# Patient Record
Sex: Female | Born: 1990 | Race: Black or African American | Hispanic: No | Marital: Single | Smoking: Former smoker
Health system: Southern US, Community
[De-identification: ages and names within clinical notes are randomized; demographics above are authoritative.]

---
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
## 2010-09-18 LAB — WET PREP, GENITAL
Trich, Wet Prep: NONE SEEN
Yeast Wet Prep HPF POC: NONE SEEN

## 2010-09-18 LAB — URINALYSIS, ROUTINE W REFLEX MICROSCOPIC
Bilirubin Urine: NEGATIVE
Glucose, UA: NEGATIVE mg/dL
Ketones, ur: NEGATIVE mg/dL
Nitrite: NEGATIVE
Protein, ur: 30 mg/dL — AB
Specific Gravity, Urine: 1.02 (ref 1.005–1.030)
Urobilinogen, UA: 0.2 mg/dL (ref 0.0–1.0)
pH: 8 (ref 5.0–8.0)

## 2010-09-18 LAB — CBC
Platelets: 197 10*3/uL (ref 150–400)
RBC: 3.95 MIL/uL (ref 3.87–5.11)
WBC: 6.1 10*3/uL (ref 4.0–10.5)

## 2010-09-18 LAB — URINE MICROSCOPIC-ADD ON

## 2010-09-18 LAB — POCT PREGNANCY, URINE: Preg Test, Ur: NEGATIVE

## 2010-09-18 LAB — GC/CHLAMYDIA PROBE AMP, GENITAL: GC Probe Amp, Genital: NEGATIVE

## 2010-09-22 LAB — URINE CULTURE: Colony Count: >=100000

## 2010-09-22 LAB — CBC
HCT: 34.4 % — ABNORMAL LOW (ref 36.0–46.0)
MCHC: 33.9 g/dL (ref 30.0–36.0)
MCV: 91 fL (ref 78.0–100.0)
Platelets: 193 10*3/uL (ref 150–400)
WBC: 8.1 10*3/uL (ref 4.0–10.5)

## 2010-09-22 LAB — URINALYSIS, ROUTINE W REFLEX MICROSCOPIC
Bilirubin Urine: NEGATIVE
Glucose, UA: NEGATIVE mg/dL
Hgb urine dipstick: NEGATIVE
Ketones, ur: 15 mg/dL — AB
Protein, ur: NEGATIVE mg/dL
pH: 7 (ref 5.0–8.0)

## 2010-09-22 LAB — URINE MICROSCOPIC-ADD ON

## 2010-09-22 LAB — WET PREP, GENITAL

## 2010-09-22 LAB — GC/CHLAMYDIA PROBE AMP, GENITAL
Chlamydia, DNA Probe: POSITIVE — AB
GC Probe Amp, Genital: NEGATIVE

## 2014-04-03 ENCOUNTER — Emergency Department (HOSPITAL_COMMUNITY)
Admission: EM | Admit: 2014-04-03 | Discharge: 2014-04-03 | Disposition: A | Discharge status: Home / Self Care | Payer: No Typology Code available for payment source | Attending: Emergency Medicine | Admitting: Emergency Medicine

## 2014-04-03 ENCOUNTER — Encounter (HOSPITAL_COMMUNITY): Payer: Self-pay | Admitting: Emergency Medicine

## 2014-04-03 DIAGNOSIS — Y9389 Activity, other specified: Secondary | ICD-10-CM | POA: Diagnosis not present

## 2014-04-03 DIAGNOSIS — S060X0A Concussion without loss of consciousness, initial encounter: Secondary | ICD-10-CM | POA: Insufficient documentation

## 2014-04-03 DIAGNOSIS — Z793 Long term (current) use of hormonal contraceptives: Secondary | ICD-10-CM | POA: Diagnosis not present

## 2014-04-03 DIAGNOSIS — S0990XA Unspecified injury of head, initial encounter: Secondary | ICD-10-CM

## 2014-04-03 DIAGNOSIS — Y92481 Parking lot as the place of occurrence of the external cause: Secondary | ICD-10-CM | POA: Insufficient documentation

## 2014-04-03 MED ORDER — NAPROXEN 500 MG PO TABS: 500.0000 mg | ORAL_TABLET | Freq: Two times a day (BID) | ORAL | Status: AC

## 2014-04-03 MED ORDER — KETOROLAC TROMETHAMINE 60 MG/2ML IM SOLN
60.0000 mg | Freq: Once | INTRAMUSCULAR | Status: AC
  Administered 2014-04-03: 60 mg via INTRAMUSCULAR
  Filled 2014-04-03: qty 2

## 2014-04-03 NOTE — Discharge Instructions (Signed)
Please call your doctor for a followup appointment within 24-48 hours. When you talk to your doctor please let them know that you were seen in the emergency department and have them acquire all of your records so that they can discuss the findings with you and formulate a treatment plan to fully care for your new and ongoing problems. ° °
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
## 2015-09-02 ENCOUNTER — Emergency Department (HOSPITAL_COMMUNITY)
Admission: EM | Admit: 2015-09-02 | Discharge: 2015-09-02 | Disposition: A | Discharge status: Home / Self Care | Payer: No Typology Code available for payment source | Attending: Emergency Medicine | Admitting: Emergency Medicine

## 2015-09-02 ENCOUNTER — Encounter (HOSPITAL_COMMUNITY): Payer: Self-pay

## 2015-09-02 DIAGNOSIS — S0181XA Laceration without foreign body of other part of head, initial encounter: Secondary | ICD-10-CM | POA: Insufficient documentation

## 2015-09-02 DIAGNOSIS — Y9389 Activity, other specified: Secondary | ICD-10-CM | POA: Insufficient documentation

## 2015-09-02 DIAGNOSIS — S0191XA Laceration without foreign body of unspecified part of head, initial encounter: Secondary | ICD-10-CM

## 2015-09-02 DIAGNOSIS — Z791 Long term (current) use of non-steroidal anti-inflammatories (NSAID): Secondary | ICD-10-CM | POA: Insufficient documentation

## 2015-09-02 DIAGNOSIS — Y990 Civilian activity done for income or pay: Secondary | ICD-10-CM | POA: Insufficient documentation

## 2015-09-02 DIAGNOSIS — W228XXA Striking against or struck by other objects, initial encounter: Secondary | ICD-10-CM | POA: Insufficient documentation

## 2015-09-02 DIAGNOSIS — Y9289 Other specified places as the place of occurrence of the external cause: Secondary | ICD-10-CM | POA: Insufficient documentation

## 2015-09-02 DIAGNOSIS — F17210 Nicotine dependence, cigarettes, uncomplicated: Secondary | ICD-10-CM | POA: Insufficient documentation

## 2015-09-02 DIAGNOSIS — Z793 Long term (current) use of hormonal contraceptives: Secondary | ICD-10-CM | POA: Insufficient documentation

## 2015-09-02 DIAGNOSIS — Z23 Encounter for immunization: Secondary | ICD-10-CM | POA: Insufficient documentation

## 2015-09-02 MED ORDER — TETANUS-DIPHTH-ACELL PERTUSSIS 5-2.5-18.5 LF-MCG/0.5 IM SUSP
0.5000 mL | Freq: Once | INTRAMUSCULAR | Status: AC
  Administered 2015-09-02: 0.5 mL via INTRAMUSCULAR
  Filled 2015-09-02: qty 0.5

## 2015-09-02 MED ORDER — MUPIROCIN 2 % EX OINT: TOPICAL_OINTMENT | CUTANEOUS | Status: AC

## 2015-09-02 NOTE — ED Provider Notes (Signed)
CSN: [id number]     Arrival date & time 09/02/15  2141 History  By signing my name below, I, Karen Montgomery, attest that this documentation has been prepared under the direction and in the presence of  [facility], PA-C. Electronically Signed: Doreatha Montgomery, ED Scribe. 09/02/2015. 10:15 PM.     Chief Complaint  Patient presents with  . Head Laceration   The history is provided by the patient. No language interpreter was used.    HPI Comments: ARIYANNAH Montgomery is a 25 y.o. female who presents to the Emergency Department complaining of a laceration with controlled bleeding to the left forehead that occurred yesterday after hitting her head on a plastic buckle at work. She denies LOC, nausea, emesis. She reports associated mild tenderness with palpation to the area. Pt notes that she cleaned the wound after initial injury. Tdap out of date. Pt denies additional injuries, numbness.   History reviewed. No pertinent past medical history. History reviewed. No pertinent past surgical history. No family history on file. Social History  Substance Use Topics  . Smoking status: Current Some Day Smoker    Types: Cigarettes  . Smokeless tobacco: None  . Alcohol Use: Yes     Comment: occasional   OB History    No data available     Review of Systems  Gastrointestinal: Negative for nausea and vomiting.  Musculoskeletal: Positive for arthralgias.  Skin: Positive for wound.  Neurological: Negative for numbness.   Allergies  Review of patient's allergies indicates no known allergies.  Home Medications   Prior to Admission medications   Medication Sig Start Date End Date Taking? Authorizing Provider  ibuprofen (ADVIL,MOTRIN) 200 MG tablet Take 400 mg by mouth every 6 (six) hours as needed for mild pain.    Historical Provider, MD  mupirocin ointment (BACTROBAN) 2 % Apply to affected area twice daily. 09/02/15   Chase Picket Ossie Yebra, PA-C  naproxen (NAPROSYN) 500 MG tablet Take 1 tablet (500 mg  total) by mouth 2 (two) times daily with a meal. 04/03/14   Eber Hong, MD  norethindrone-ethinyl estradiol (JUNEL FE,GILDESS FE,LOESTRIN FE) 1-20 MG-MCG tablet Take 1 tablet by mouth daily.    Historical Provider, MD   BP 105/61 mmHg  Pulse 62  Temp(Src) 98.2 F (36.8 C) (Oral)  Resp 16  Ht  (1.676 m)  Wt 50.122 kg  BMI 17.84 kg/m2  SpO2 100%  LMP 08/23/2015 Physical Exam  Constitutional: She is oriented to person, place, and time. She appears well-developed and well-nourished.  HENT:  Head: Normocephalic.  Neck: Normal range of motion. Neck supple.  Cardiovascular: Normal rate, regular rhythm and normal heart sounds.  Exam reveals no gallop and no friction rub.   No murmur heard. Pulmonary/Chest: Effort normal and breath sounds normal. No respiratory distress. She has no wheezes. She has no rales.  Abdominal: She exhibits no distension.  Musculoskeletal: Normal range of motion.  Neurological: She is alert and oriented to person, place, and time.  Skin: Skin is warm and dry.  0.5 cm laceration to the left forehead. Bleeding controlled. No surrounding erythema.  Psychiatric: She has a normal mood and affect. Her behavior is normal.  Nursing note and vitals reviewed.   ED Course  Procedures (including critical care time) DIAGNOSTIC STUDIES: Oxygen Saturation is 99% on RA, normal by my interpretation.    COORDINATION OF CARE: 10:14 PM Discussed treatment plan with pt at bedside which includes Tdap update, wound care and pt agreed to plan.  MDM   Final diagnoses:  Laceration of head, initial encounter    Karen Montgomery presents to the ED with a 0.5 cm laceration to the left temple with hemostasis that occurred yesterday. Wound care performed in the ED. Lac size is small and issue not viable for laceration repair. Pt will be sent home with mupirocin ointment. Home care instructions discussed and recommended. Pt advised to follow up with PCP as needed, or with worsening  symptoms. Pt appears stable for discharge at this time. Return precautions discussed and outlined in discharge paperwork. Pt is agreeable to plan.   I personally performed the services described in this documentation, which was scribed in my presence. The recorded information has been reviewed and is accurate.   [facility] Arisbeth Purrington, PA-C 09/02/15 1610  Pricilla Loveless, MD 09/03/15 [phone]
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
## 2015-09-02 NOTE — Discharge Instructions (Signed)
Keep affected area clean and dry. Use antibiotic ointment as directed. Return to ER for any new or worsening symptoms, any additional concerns.

## 2016-01-21 ENCOUNTER — Emergency Department (HOSPITAL_COMMUNITY)
Admission: EM | Admit: 2016-01-21 | Discharge: 2016-01-21 | Disposition: A | Discharge status: Home / Self Care | Payer: No Typology Code available for payment source | Attending: Emergency Medicine | Admitting: Emergency Medicine

## 2016-01-21 ENCOUNTER — Encounter (HOSPITAL_COMMUNITY): Payer: Self-pay

## 2016-01-21 DIAGNOSIS — R21 Rash and other nonspecific skin eruption: Secondary | ICD-10-CM | POA: Insufficient documentation

## 2016-01-21 DIAGNOSIS — F17210 Nicotine dependence, cigarettes, uncomplicated: Secondary | ICD-10-CM | POA: Insufficient documentation

## 2016-01-21 LAB — WET PREP, GENITAL
SPERM: NONE SEEN
TRICH WET PREP: NONE SEEN
YEAST WET PREP: NONE SEEN

## 2016-01-21 LAB — POC URINE PREG, ED: Preg Test, Ur: NEGATIVE

## 2016-01-21 NOTE — ED Notes (Signed)
Pt. C/o rash around groin area that started yesterday morning. Pt. Notes that the rash is itchy. Pt. Does report putting an OTC cream on it. Pt. Last sexually active Tuesday. Pt. Denies pain.

## 2016-01-21 NOTE — ED Provider Notes (Signed)
CSN: [id number]     Arrival date & time 01/21/16  4098 History   First MD Initiated Contact with Patient 01/21/16 0932     Chief Complaint  Patient presents with  . Rash     (Consider location/radiation/quality/duration/timing/severity/associated sxs/prior Treatment) HPI  25 year old female presents with a rash that she noticed yesterday morning when she first woke up. The rash has not worsened but also has not improved. It is itchy and sometimes irritated with burning whenever it is rubbed against her pants. Some of the rash she has noticed seems to drain. Denies vaginal bleeding or discharge. She does have unprotected intercourse and is now concerned about possible STI but has never had one before and does not have a partner that currently has symptoms. When not touched, the rash does not burn or hurt. Tried hydrocortisone cream that her grandmother gave her with no relief.  History reviewed. No pertinent past medical history. History reviewed. No pertinent past surgical history. History reviewed. No pertinent family history. Social History  Substance Use Topics  . Smoking status: Current Some Day Smoker    Types: Cigarettes  . Smokeless tobacco: None  . Alcohol Use: Yes     Comment: occasional   OB History    No data available     Review of Systems  Constitutional: Negative for fever.  Gastrointestinal: Negative for abdominal pain.  Genitourinary: Negative for dysuria, vaginal bleeding, vaginal discharge and menstrual problem.  Skin: Positive for rash.  All other systems reviewed and are negative.     Allergies  Review of patient's allergies indicates no known allergies.  Home Medications   Prior to Admission medications   Medication Sig Start Date End Date Taking? Authorizing Provider  ibuprofen (ADVIL,MOTRIN) 200 MG tablet Take 400 mg by mouth every 6 (six) hours as needed for mild pain.    Historical Provider, MD  mupirocin ointment (BACTROBAN) 2 % Apply to  affected area twice daily. 09/02/15   Chase Picket Ward, PA-C  naproxen (NAPROSYN) 500 MG tablet Take 1 tablet (500 mg total) by mouth 2 (two) times daily with a meal. 04/03/14   Eber Hong, MD  norethindrone-ethinyl estradiol (JUNEL FE,GILDESS FE,LOESTRIN FE) 1-20 MG-MCG tablet Take 1 tablet by mouth daily.    Historical Provider, MD   BP 105/89 mmHg  Pulse 73  Temp(Src) 98.8 F (37.1 C) (Oral)  Resp 16  Ht  (1.676 m)  Wt 115 lb (52.164 kg)  BMI 18.57 kg/m2  SpO2 100%  LMP 12/21/2015 Physical Exam  Constitutional: She is oriented to person, place, and time. She appears well-developed and well-nourished.  HENT:  Head: Normocephalic and atraumatic.  Right Ear: External ear normal.  Left Ear: External ear normal.  Nose: Nose normal.  Eyes: Right eye exhibits no discharge. Left eye exhibits no discharge.  Cardiovascular: Normal rate, regular rhythm and normal heart sounds.   Pulmonary/Chest: Effort normal and breath sounds normal.  Abdominal: Soft. She exhibits no distension. There is no tenderness.    Genitourinary: Cervix exhibits no discharge and no friability. No bleeding in the vagina. No vaginal discharge found.  Neurological: She is alert and oriented to person, place, and time.  Skin: Skin is warm and dry.  Nursing note and vitals reviewed.   ED Course  Procedures (including critical care time) Labs Review Labs Reviewed  WET PREP, GENITAL - Abnormal; Notable for the following:    Clue Cells Wet Prep HPF POC PRESENT (*)    WBC, Wet Prep HPF POC  FEW (*)    All other components within normal limits  HSV CULTURE AND TYPING  RPR  HIV ANTIBODY (ROUTINE TESTING)  POC URINE PREG, ED  GC/CHLAMYDIA PROBE AMP ([place]    Imaging Review No results found. I have personally reviewed and evaluated these images and lab results as part of my medical decision-making.   EKG Interpretation None      MDM   Final diagnoses:  Rash and nonspecific skin  eruption    Unclear exactly what is causing the rash. However with some clear drainage, will test for H SV, although it does not appear consistent with herpes. Given it is not tender, will send for syphilis testing. She does not have any vaginal discharge or vaginal symptoms and thus do not think the clue cells need to be treated. We'll send for gonorrhea, chlamydia, RPR, HIV. Discussed these results when I come back today. At this point I do not think it is an STI but given location needs to be tested. Discussed follow-up with OB/GYN for yearly screening.    Pricilla LovelessScott Ruairi Stutsman, MD 01/21/16 [phone]

## 2016-01-22 LAB — RPR: RPR: NONREACTIVE

## 2016-01-22 LAB — HIV ANTIBODY (ROUTINE TESTING W REFLEX): HIV SCREEN 4TH GENERATION: NONREACTIVE

## 2016-01-24 LAB — GC/CHLAMYDIA PROBE AMP (~~LOC~~) NOT AT ARMC
Chlamydia: POSITIVE — AB
Neisseria Gonorrhea: NEGATIVE

## 2016-01-25 ENCOUNTER — Telehealth (HOSPITAL_COMMUNITY): Payer: Self-pay

## 2016-01-25 LAB — HSV CULTURE AND TYPING

## 2016-01-25 NOTE — Telephone Encounter (Signed)
Positive for chlamydia. Chart sent to edp office for review 

## 2016-01-26 ENCOUNTER — Telehealth (HOSPITAL_BASED_OUTPATIENT_CLINIC_OR_DEPARTMENT_OTHER): Payer: Self-pay | Admitting: *Deleted

## 2016-01-26 NOTE — Telephone Encounter (Signed)
Positive for chlamydia and was prescribed azithromycin 1000mg . Po x 1dose no refill prescribed by Thornton Dales. Called [facility] pharmacy as patient choice at 4:42.

## 2016-10-27 ENCOUNTER — Emergency Department (HOSPITAL_COMMUNITY)
Admission: EM | Admit: 2016-10-27 | Discharge: 2016-10-28 | Disposition: A | Discharge status: Home / Self Care | Payer: Self-pay | Attending: Emergency Medicine | Admitting: Emergency Medicine

## 2016-10-27 DIAGNOSIS — H1031 Unspecified acute conjunctivitis, right eye: Secondary | ICD-10-CM | POA: Insufficient documentation

## 2016-10-27 DIAGNOSIS — F17210 Nicotine dependence, cigarettes, uncomplicated: Secondary | ICD-10-CM | POA: Insufficient documentation

## 2016-10-27 MED ORDER — TETRACAINE HCL 0.5 % OP SOLN
1.0000 [drp] | Freq: Once | OPHTHALMIC | Status: AC
  Administered 2016-10-28: 1 [drp] via OPHTHALMIC
  Filled 2016-10-27: qty 2

## 2016-10-27 MED ORDER — FLUORESCEIN SODIUM 0.6 MG OP STRP
1.0000 | ORAL_STRIP | Freq: Once | OPHTHALMIC | Status: AC
  Administered 2016-10-28: 1 via OPHTHALMIC
  Filled 2016-10-27: qty 1

## 2016-10-27 MED ORDER — CIPROFLOXACIN HCL 0.3 % OP SOLN
2.0000 [drp] | Freq: Once | OPHTHALMIC | Status: AC
  Administered 2016-10-27: 2 [drp] via OPHTHALMIC
  Filled 2016-10-27: qty 2.5

## 2016-10-27 NOTE — ED Triage Notes (Signed)
Pt c/o 7/10 right eye pain, states she thinks her contacts damage her right eye, having photophobia and pain with eye movement. Pt states she removed her Contac lent yesterday night.

## 2016-10-27 NOTE — ED Provider Notes (Signed)
[facility]-EMERGENCY DEPT Provider Note   CSN: [id number] Arrival date & time: 10/27/16  1900  By signing my name below, I, [name] and [name], attest that this documentation has been prepared under the direction and in the presence of Felicie Morn, NP  Electronically Signed: Deland Pretty and [name], ED Scribe. 10/27/16. 8:56 PM.    History   Chief Complaint Chief Complaint  Patient presents with  . Eye Pain   The history is provided by the patient. No language interpreter was used.  Eye Pain  This is a new problem. The current episode started 12 to 24 hours ago. The problem occurs constantly. The problem has been gradually worsening. Associated symptoms include headaches. Exacerbated by: light. Nothing relieves the symptoms. She has tried nothing for the symptoms. The treatment provided no relief.    HPI Comments: Karen Montgomery is a 26 y.o. female who presents to the Emergency Department complaining of 7/10 right eye pain/irritation that began last night. Per pt, she removed her contacts last night d/t a foreign body sensation behind the right one and woke up in the morning with increased pain and matting with clear drainage. Pt went to work, but due to associated photophobia, blurry right eye vision, pain with eye movement and headache, she sought treatment in the ED. The pt denies fevers, chills, and changes in vision in the other eye. The pt has no known drug allergies.   No past medical history on file.  There are no active problems to display for this patient.   No past surgical history on file.  OB History    No data available       Home Medications    Prior to Admission medications   Medication Sig Start Date End Date Taking? Authorizing Provider  hydrocortisone cream 1 % Apply 1 application topically daily as needed for itching.    Historical Provider, MD  ibuprofen (ADVIL,MOTRIN) 200 MG tablet Take 400 mg by mouth every 6 (six) hours as needed for  mild pain.    Historical Provider, MD  mupirocin ointment (BACTROBAN) 2 % Apply to affected area twice daily. Patient not taking: Reported on 01/21/2016 09/02/15   [facility] Ward, PA-C  naproxen (NAPROSYN) 500 MG tablet Take 1 tablet (500 mg total) by mouth 2 (two) times daily with a meal. Patient not taking: Reported on 01/21/2016 04/03/14   Eber Hong, MD    Family History No family history on file.  Social History Social History  Substance Use Topics  . Smoking status: Current Some Day Smoker    Types: Cigarettes  . Smokeless tobacco: Not on file  . Alcohol use Yes     Comment: occasional     Allergies   Patient has no known allergies.   Review of Systems Review of Systems  Constitutional: Negative for chills and fever.  Eyes: Positive for photophobia, pain, discharge and visual disturbance.  Neurological: Positive for headaches.  All other systems reviewed and are negative.    Physical Exam Updated Vital Signs BP (!) 124/59 (BP Location: Right Arm)   Pulse (!) 55   Temp 98.6 F (37 C) (Oral)   Resp 18   Ht  (1.676 m)   Wt 110 lb (49.9 kg)   LMP 10/17/2016   SpO2 100%   BMI 17.75 kg/m   Physical Exam  Constitutional: She appears well-developed and well-nourished. No distress.  HENT:  Head: Normocephalic and atraumatic.  Eyes: EOM and lids are normal. Pupils  are equal, round, and reactive to light. Lids are everted and swept, no foreign bodies found. Right eye exhibits discharge. Right conjunctiva is injected. Left conjunctiva is not injected.  Neck: Neck supple.  Cardiovascular: Normal rate and regular rhythm.   No murmur heard. Pulmonary/Chest: Effort normal and breath sounds normal. No respiratory distress.  Abdominal: Soft. There is no tenderness.  Musculoskeletal: She exhibits no edema.  Neurological: She is alert.  Skin: Skin is warm and dry.  Psychiatric: She has a normal mood and affect.  Nursing note and vitals reviewed.    ED  Treatments / Results  DIAGNOSTIC STUDIES: Oxygen Saturation is 100% on RA, normal by my interpretation.   COORDINATION OF CARE: 8:40 PM-Discussed next steps with pt. Pt verbalized understanding and is agreeable with the plan.   Radiology No results found.  Procedures Procedures (including critical care time)  Medications Ordered in ED Medications  fluorescein ophthalmic strip 1 strip (1 strip Right Eye Given by Other 10/28/16 0001)  tetracaine (PONTOCAINE) 0.5 % ophthalmic solution 1 drop (1 drop Right Eye Given by Other 10/28/16 0001)  ciprofloxacin (CILOXAN) 0.3 % ophthalmic solution 2 drop (2 drops Right Eye Given 10/27/16 2253)     Initial Impression / Assessment and Plan / ED Course  I have reviewed the triage vital signs and the nursing notes.  Pertinent labs & imaging results that were available during my care of the patient were reviewed by me and considered in my medical decision making (see chart for details).     Patient presentation consistent with conjunctivitis.  No evidence of corneal abrasions, entrapment, consensual photophobia, or herpes keratitis.  Presentation not concerning for iritis, or corneal abrasions.  Pt discharged with ciprofloxacin eye drops.  Personal hygiene and frequent handwashing discussed.  Patient advised to follow up with ophthalmologist if symptoms persist or worsen. Return precautions discussed.  Patient verbalizes understanding and is agreeable with discharge.  Final Clinical Impressions(s) / ED Diagnoses   Final diagnoses:  Acute conjunctivitis of right eye, unspecified acute conjunctivitis type    New Prescriptions Discharge Medication List as of 10/27/2016 11:03 PM      I personally performed the services described in this documentation, which was scribed in my presence. The recorded information has been reviewed and is accurate.     Felicie Morn, NP 10/28/16 0147    Mancel Bale, MD 10/28/16 [phone]

## 2016-10-27 NOTE — Discharge Instructions (Signed)
Instill 2 drops of the cipro into the right eye 4 times daily for 7 days.  Please follow-up with your eye care provider as discussed.

## 2016-10-27 NOTE — ED Notes (Addendum)
Pt wears glasses and contacts full time but did not have them with her which resulted in an inaccurate visual acuity.

## 2020-07-21 ENCOUNTER — Other Ambulatory Visit: Payer: Self-pay

## 2020-07-21 ENCOUNTER — Encounter (HOSPITAL_COMMUNITY): Payer: Self-pay | Admitting: Emergency Medicine

## 2020-07-21 ENCOUNTER — Ambulatory Visit (HOSPITAL_COMMUNITY)
Admission: EM | Admit: 2020-07-21 | Discharge: 2020-07-21 | Disposition: A | Discharge status: Home / Self Care | Payer: HRSA Program | Attending: Emergency Medicine | Admitting: Emergency Medicine

## 2020-07-21 DIAGNOSIS — R519 Headache, unspecified: Secondary | ICD-10-CM | POA: Insufficient documentation

## 2020-07-21 DIAGNOSIS — F17210 Nicotine dependence, cigarettes, uncomplicated: Secondary | ICD-10-CM | POA: Diagnosis not present

## 2020-07-21 DIAGNOSIS — R509 Fever, unspecified: Secondary | ICD-10-CM | POA: Insufficient documentation

## 2020-07-21 DIAGNOSIS — B349 Viral infection, unspecified: Secondary | ICD-10-CM | POA: Diagnosis not present

## 2020-07-21 DIAGNOSIS — U071 COVID-19: Secondary | ICD-10-CM | POA: Insufficient documentation

## 2020-07-21 NOTE — ED Provider Notes (Signed)
[facility]    CSN: [id number] Arrival date & time: 07/21/20  1629      History   Chief Complaint Chief Complaint  Patient presents with  . Headache    HPI Karen Montgomery is a 30 y.o. female.   Patient presents with 3-day history of headache and low-grade fever.  She denies cough, shortness of breath, vomiting, diarrhea, or other symptoms.  Treatment attempted at home with Tylenol.  No pertinent medical history.  The history is provided by the patient.    History reviewed. No pertinent past medical history.  There are no problems to display for this patient.   History reviewed. No pertinent surgical history.  OB History   No obstetric history on file.      Home Medications    Prior to Admission medications   Medication Sig Start Date End Date Taking? Authorizing Provider  hydrocortisone cream 1 % Apply 1 application topically daily as needed for itching.    [provider]  ibuprofen (ADVIL,MOTRIN) 200 MG tablet Take 400 mg by mouth every 6 (six) hours as needed for mild pain.    [provider]  mupirocin ointment (BACTROBAN) 2 % Apply to affected area twice daily. Patient not taking: Reported on 01/21/2016 09/02/15   Ward, Chase Picket, PA-C  naproxen (NAPROSYN) 500 MG tablet Take 1 tablet (500 mg total) by mouth 2 (two) times daily with a meal. Patient not taking: Reported on 01/21/2016 04/03/14   Eber Hong, MD    Family History History reviewed. No pertinent family history.  Social History Social History   Tobacco Use  . Smoking status: Current Some Day Smoker    Types: Cigarettes  . Smokeless tobacco: Never Used  Substance Use Topics  . Alcohol use: Yes    Comment: occasional  . Drug use: No     Allergies   Patient has no known allergies.   Review of Systems Review of Systems  Constitutional: Positive for fever. Negative for chills.  HENT: Negative for ear pain and sore throat.   Eyes: Negative for pain and  visual disturbance.  Respiratory: Negative for cough and shortness of breath.   Cardiovascular: Negative for chest pain and palpitations.  Gastrointestinal: Negative for abdominal pain, diarrhea and vomiting.  Genitourinary: Negative for dysuria and hematuria.  Musculoskeletal: Negative for arthralgias and back pain.  Skin: Negative for color change and rash.  Neurological: Positive for headaches. Negative for syncope, weakness and numbness.  All other systems reviewed and are negative.    Physical Exam Triage Vital Signs ED Triage Vitals  Enc Vitals Group     BP      Pulse      Resp      Temp      Temp src      SpO2      Weight      Height      Head Circumference      Peak Flow      Pain Score      Pain Loc      Pain Edu?      Excl. in GC?    No data found.  Updated Vital Signs BP 114/67 (BP Location: Right Arm)   Pulse 86   Temp 99.9 F (37.7 C) (Oral)   Resp 16   Ht 5\' 6"  (1.676 m)   Wt 115 lb (52.2 kg)   LMP 07/06/2020   SpO2 98%   BMI 18.56 kg/m   Visual Acuity  Right Eye Distance:   Left Eye Distance:   Bilateral Distance:    Right Eye Near:   Left Eye Near:    Bilateral Near:     Physical Exam Vitals and nursing note reviewed.  Constitutional:      General: She is not in acute distress.    Appearance: She is well-developed and well-nourished. She is not ill-appearing.  HENT:     Head: Normocephalic and atraumatic.     Right Ear: Tympanic membrane normal.     Left Ear: Tympanic membrane normal.     Nose: Nose normal.     Mouth/Throat:     Mouth: Mucous membranes are moist.     Pharynx: Oropharynx is clear.  Eyes:     Conjunctiva/sclera: Conjunctivae normal.  Cardiovascular:     Rate and Rhythm: Normal rate and regular rhythm.     Heart sounds: Normal heart sounds.  Pulmonary:     Effort: Pulmonary effort is normal. No respiratory distress.     Breath sounds: Normal breath sounds.  Abdominal:     Palpations: Abdomen is soft.      Tenderness: There is no abdominal tenderness. There is no guarding or rebound.  Musculoskeletal:        General: No edema.     Cervical back: Neck supple.  Skin:    General: Skin is warm and dry.     Findings: No rash.  Neurological:     General: No focal deficit present.     Mental Status: She is alert and oriented to person, place, and time.     Sensory: No sensory deficit.     Motor: No weakness.     Gait: Gait normal.  Psychiatric:        Mood and Affect: Mood and affect and mood normal.        Behavior: Behavior normal.      UC Treatments / Results  Labs (all labs ordered are listed, but only abnormal results are displayed) Labs Reviewed  SARS CORONAVIRUS 2 (TAT 6-24 HRS)    EKG   Radiology No results found.  Procedures Procedures (including critical care time)  Medications Ordered in UC Medications - No data to display  Initial Impression / Assessment and Plan / UC Course  I have reviewed the triage vital signs and the nursing notes.  Pertinent labs & imaging results that were available during my care of the patient were reviewed by me and considered in my medical decision making (see chart for details).   Viral illness.  COVID pending.  Instructed patient to self quarantine until the test results are back.  Discussed symptomatic treatment including Tylenol, rest, hydration.  Instructed patient to follow up with PCP if her symptoms are not improving.  Patient agrees to plan of care.    Final Clinical Impressions(s) / UC Diagnoses   Final diagnoses:  Viral illness     Discharge Instructions     Your COVID test is pending.  You should self quarantine until the test result is back.    Take Tylenol or ibuprofen as needed for fever or discomfort.  Rest and keep yourself hydrated.    Follow-up with your primary care provider if your symptoms are not improving.        ED Prescriptions    None     PDMP not reviewed this encounter.   Mickie Bail, NP 07/21/20 1816

## 2020-07-21 NOTE — Discharge Instructions (Addendum)
Your COVID test is pending.  You should self quarantine until the test result is back.    Take Tylenol or ibuprofen as needed for fever or discomfort.  Rest and keep yourself hydrated.    Follow-up with your primary care provider if your symptoms are not improving.     

## 2020-07-21 NOTE — ED Triage Notes (Signed)
Patient c/o headache and fever x 3 days.   Patient endorses a temperature of 22F at home.   Patient endorses being around an individual with a "cold".   Patient has taken Tylenol at home w/ no relief of symptoms.

## 2020-07-22 LAB — SARS CORONAVIRUS 2 (TAT 6-24 HRS): SARS Coronavirus 2: POSITIVE — AB

## 2021-09-21 ENCOUNTER — Emergency Department (HOSPITAL_BASED_OUTPATIENT_CLINIC_OR_DEPARTMENT_OTHER)
Admission: EM | Admit: 2021-09-21 | Discharge: 2021-09-21 | Disposition: A | Discharge status: Home / Self Care | Payer: No Typology Code available for payment source | Attending: Emergency Medicine | Admitting: Emergency Medicine

## 2021-09-21 ENCOUNTER — Other Ambulatory Visit: Payer: Self-pay

## 2021-09-21 ENCOUNTER — Encounter (HOSPITAL_BASED_OUTPATIENT_CLINIC_OR_DEPARTMENT_OTHER): Payer: Self-pay

## 2021-09-21 ENCOUNTER — Emergency Department (HOSPITAL_BASED_OUTPATIENT_CLINIC_OR_DEPARTMENT_OTHER): Payer: No Typology Code available for payment source

## 2021-09-21 DIAGNOSIS — S0990XA Unspecified injury of head, initial encounter: Secondary | ICD-10-CM

## 2021-09-21 DIAGNOSIS — Y92410 Unspecified street and highway as the place of occurrence of the external cause: Secondary | ICD-10-CM | POA: Insufficient documentation

## 2021-09-21 DIAGNOSIS — S060X0A Concussion without loss of consciousness, initial encounter: Secondary | ICD-10-CM | POA: Diagnosis not present
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
## 2021-09-21 NOTE — ED Notes (Signed)
Patient given discharge instructions. Questions were answered. Patient verbalized understanding of discharge instructions and care at home.  

## 2021-09-21 NOTE — Discharge Instructions (Signed)
Your CT scan did not show any acute findings. Your headache is likely related to a minor concussion. Please take Ibuprofen and Tylenol as needed for pain. Attached is additional information on brain rest. It is recommended that you drink plenty of fluids to stay hydrated and rest as much as possible. Avoid bright lights from cell phones, TV screens, computers, etc.  ? ?Follow up with [facility] as needed for primary care needs. You can also follow up at the concussion clinic:  ?If you, your child, or a loved one you care for has suffered a sports-related head injury or potential concussion, we know how important it is to have access to the best advice and treatment. The [facility] is the only comprehensive, holistic concussion clinic in the [place], [place] area. You can speak with one of our concussion-trained staff members during our regular office hours, Monday - Thursday from 7:30 AM to 4:30 PM, and Fridays from 7:30 AM to 12:00 PM, by calling our Concussion Hotline: [phone]. ? ?Return to the ED for any new/worsening symptoms ?
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
## 2021-11-01 ENCOUNTER — Ambulatory Visit: Payer: Self-pay | Admitting: *Deleted
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
## 2021-11-07 ENCOUNTER — Ambulatory Visit: Payer: Self-pay | Admitting: Physician Assistant

## 2021-11-16 ENCOUNTER — Ambulatory Visit (INDEPENDENT_AMBULATORY_CARE_PROVIDER_SITE_OTHER): Payer: Self-pay | Admitting: Physician Assistant

## 2021-11-16 ENCOUNTER — Encounter: Payer: Self-pay | Admitting: Physician Assistant

## 2021-11-16 VITALS — BP 122/72 | HR 69 | Temp 98.7°F | Resp 18 | Ht 66.0 in | Wt 117.0 lb

## 2021-11-16 DIAGNOSIS — E559 Vitamin D deficiency, unspecified: Secondary | ICD-10-CM

## 2021-11-16 DIAGNOSIS — G43009 Migraine without aura, not intractable, without status migrainosus: Secondary | ICD-10-CM

## 2021-11-16 MED ORDER — IBUPROFEN 600 MG PO TABS: 600.0000 mg | ORAL_TABLET | Freq: Three times a day (TID) | ORAL | 0 refills | Status: AC | PRN

## 2021-11-16 MED ORDER — ONDANSETRON 4 MG PO TBDP: 4.0000 mg | ORAL_TABLET | Freq: Three times a day (TID) | ORAL | 0 refills | Status: AC | PRN

## 2021-11-16 NOTE — Progress Notes (Signed)
Patient has eaten today. Patient has not taken medication today. Patient reports a R side frontal HA being present. Patient reports waking up with HA, the HA eases off throughout the day. 

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
## 2021-11-16 NOTE — Patient Instructions (Addendum)
To help with your headaches, you can use ibuprofen 600 mg every 8 hours, to help with the nausea you can use Zofran every 8 hours as well. ? ?I strongly encourage you to increase your sleep, you should get 7 to 8 hours of sleep a night.  I strongly encourage you to increase your water intake, you should be drinking at least 64 ounces of water a day. ? ?We will call you with today's lab results. ? ?Roney Jaffe, PA-C ?Physician Assistant ?[facility] ?[email or web address] ? ?Managing Anxiety, Adult ?After being diagnosed with anxiety, you may be relieved to know why you have felt or behaved a certain way. You may also feel overwhelmed about the treatment ahead and what it will mean for your life. With care and support, you can manage this condition. ?How to manage lifestyle changes ?Managing stress and anxiety ? ?Stress is your body's reaction to life changes and events, both good and bad. Most stress will last just a few hours, but stress can be ongoing and can lead to more than just stress. Although stress can play a major role in anxiety, it is not the same as anxiety. Stress is usually caused by something external, such as a deadline, test, or competition. Stress normally passes after the triggering event has ended.  ?Anxiety is caused by something internal, such as imagining a terrible outcome or worrying that something will go wrong that will devastate you. Anxiety often does not go away even after the triggering event is over, and it can become long-term (chronic) worry. It is important to understand the differences between stress and anxiety and to manage your stress effectively so that it does not lead to an anxious response. ?Talk with your health care provider or a counselor to learn more about reducing anxiety and stress. He or she may suggest tension reduction techniques, such as: ?Music therapy. Spend time creating or listening to music that you  enjoy and that inspires you. ?Mindfulness-based meditation. Practice being aware of your normal breaths while not trying to control your breathing. It can be done while sitting or walking. ?Centering prayer. This involves focusing on a word, phrase, or sacred image that means something to you and brings you peace. ?Deep breathing. To do this, expand your stomach and inhale slowly through your nose. Hold your breath for 3-5 seconds. Then exhale slowly, letting your stomach muscles relax. ?Self-talk. Learn to notice and identify thought patterns that lead to anxiety reactions and change those patterns to thoughts that feel peaceful. ?Muscle relaxation. Taking time to tense muscles and then relax them. ?Choose a tension reduction technique that fits your lifestyle and personality. These techniques take time and practice. Set aside 5-15 minutes a day to do them. Therapists can offer counseling and training in these techniques. The training to help with anxiety may be covered by some insurance plans. ?Other things you can do to manage stress and anxiety include: ?Keeping a stress diary. This can help you learn what triggers your reaction and then learn ways to manage your response. ?Thinking about how you react to certain situations. You may not be able to control everything, but you can control your response. ?Making time for activities that help you relax and not feeling guilty about spending your time in this way. ?Doing visual imagery. This involves imagining or creating mental pictures to help you relax. ?Practicing yoga. Through yoga poses, you can lower tension and promote relaxation. ? ?Medicines ?Medicines can help ease symptoms.  Medicines for anxiety include: ?Antidepressant medicines. These are usually prescribed for long-term daily control. ?Anti-anxiety medicines. These may be added in severe cases, especially when panic attacks occur. ?Medicines will be prescribed by a health care provider. When used  together, medicines, psychotherapy, and tension reduction techniques may be the most effective treatment. ?Relationships ?Relationships can play a big part in helping you recover. Try to spend more time connecting with trusted friends and family members. ?Consider going to couples counseling if you have a partner, taking family education classes, or going to family therapy. ?Therapy can help you and others better understand your condition. ?How to recognize changes in your anxiety ?Everyone responds differently to treatment for anxiety. Recovery from anxiety happens when symptoms decrease and stop interfering with your daily activities at home or work. This may mean that you will start to: ?Have better concentration and focus. Worry will interfere less in your daily thinking. ?Sleep better. ?Be less irritable. ?Have more energy. ?Have improved memory. ?It is also important to recognize when your condition is getting worse. Contact your health care provider if your symptoms interfere with home or work and you feel like your condition is not improving. ?Follow these instructions at home: ?Activity ?Exercise. Adults should do the following: ?Exercise for at least 150 minutes each week. The exercise should increase your heart rate and make you sweat (moderate-intensity exercise). ?Strengthening exercises at least twice a week. ?Get the right amount and quality of sleep. Most adults need 7-9 hours of sleep each night. ?Lifestyle ? ?Eat a healthy diet that includes plenty of vegetables, fruits, whole grains, low-fat dairy products, and lean protein. ?Do not eat a lot of foods that are high in fats, added sugars, or salt (sodium). ?Make choices that simplify your life. ?Do not use any products that contain nicotine or tobacco. These products include cigarettes, chewing tobacco, and vaping devices, such as e-cigarettes. If you need help quitting, ask your health care provider. ?Avoid caffeine, alcohol, and certain  over-the-counter cold medicines. These may make you feel worse. Ask your pharmacist which medicines to avoid. ?General instructions ?Take over-the-counter and prescription medicines only as told by your health care provider. ?Keep all follow-up visits. This is important. ?Where to find support ?You can get help and support from these sources: ?Self-help groups. ?Online and [employer]community organizations. ?A trusted spiritual leader. ?Couples counseling. ?Family education classes. ?Family therapy. ?Where to find more information ?You may find that joining a support group helps you deal with your anxiety. The following sources can help you locate counselors or support groups near you: ?Mental Health America: www.mentalhealthamerica.net ?Anxiety and Depression Association of America (ADAA): ProgramCam.dewww.adaa.org ?National Alliance on Mental Illness (NAMI): www.nami.org ?Contact a health care provider if: ?You have a hard time staying focused or finishing daily tasks. ?You spend many hours a day feeling worried about everyday life. ?You become exhausted by worry. ?You start to have headaches or frequently feel tense. ?You develop chronic nausea or diarrhea. ?Get help right away if: ?You have a racing heart and shortness of breath. ?You have thoughts of hurting yourself or others. ?If you ever feel like you may hurt yourself or others, or have thoughts about taking your own life, get help right away. Go to your nearest emergency department or: ?Call your local emergency services (911 in the [place] ?Call a suicide crisis helpline, such as the National Suicide Prevention Lifeline at 740-701-62531-(228)216-8206 or 988 in the [place] This is open 24 hours a day in the [place] ?Text the  Crisis Text Line at (317) 635-6635 (in the [place] ?Summary ?Taking steps to learn and use tension reduction techniques can help calm you and help prevent triggering an anxiety reaction. ?When used together, medicines, psychotherapy, and tension reduction techniques may be the most  effective treatment. ?Family, friends, and partners can play a big part in supporting you. ?This information is not intended to replace advice given to you by your health care provider. Make sure you discuss any questions

## 2021-11-17 DIAGNOSIS — E559 Vitamin D deficiency, unspecified: Secondary | ICD-10-CM | POA: Insufficient documentation

## 2021-11-17 LAB — COMP. METABOLIC PANEL (12)
AST: 15 IU/L (ref 0–40)
Albumin/Globulin Ratio: 1.8 (ref 1.2–2.2)
Albumin: 4.6 g/dL (ref 3.9–5.0)
Alkaline Phosphatase: 54 IU/L (ref 44–121)
BUN/Creatinine Ratio: 14 (ref 9–23)
BUN: 12 mg/dL (ref 6–20)
Bilirubin Total: <0.2 mg/dL (ref 0.0–1.2)
Calcium: 9.2 mg/dL (ref 8.7–10.2)
Chloride: 102 mmol/L (ref 96–106)
Creatinine, Ser: 0.83 mg/dL (ref 0.57–1.00)
Globulin, Total: 2.6 g/dL (ref 1.5–4.5)
Glucose: 75 mg/dL (ref 70–99)
Potassium: 4.2 mmol/L (ref 3.5–5.2)
Sodium: 141 mmol/L (ref 134–144)
Total Protein: 7.2 g/dL (ref 6.0–8.5)
eGFR: 97 mL/min/{1.73_m2} (ref >59)

## 2021-11-17 LAB — CBC WITH DIFFERENTIAL/PLATELET
Basophils Absolute: 0 10*3/uL (ref 0.0–0.2)
Basos: 1 %
EOS (ABSOLUTE): 0.1 10*3/uL (ref 0.0–0.4)
Eos: 2 %
Hematocrit: 38.1 % (ref 34.0–46.6)
Hemoglobin: 12.7 g/dL (ref 11.1–15.9)
Immature Grans (Abs): 0 10*3/uL (ref 0.0–0.1)
Immature Granulocytes: 0 %
Lymphocytes Absolute: 2.2 10*3/uL (ref 0.7–3.1)
Lymphs: 41 %
MCH: 31.5 pg (ref 26.6–33.0)
MCHC: 33.3 g/dL (ref 31.5–35.7)
MCV: 95 fL (ref 79–97)
Monocytes Absolute: 0.4 10*3/uL (ref 0.1–0.9)
Monocytes: 7 %
Neutrophils Absolute: 2.6 10*3/uL (ref 1.4–7.0)
Neutrophils: 49 %
Platelets: 149 10*3/uL — ABNORMAL LOW (ref 150–450)
RBC: 4.03 x10E6/uL (ref 3.77–5.28)
RDW: 12.6 % (ref 11.7–15.4)
WBC: 5.3 10*3/uL (ref 3.4–10.8)

## 2021-11-17 LAB — TSH: TSH: 1.54 u[IU]/mL (ref 0.450–4.500)

## 2021-11-17 LAB — VITAMIN D 25 HYDROXY (VIT D DEFICIENCY, FRACTURES): Vit D, 25-Hydroxy: 9.4 ng/mL — ABNORMAL LOW (ref 30.0–100.0)

## 2021-11-17 MED ORDER — VITAMIN D (ERGOCALCIFEROL) 1.25 MG (50000 UNIT) PO CAPS: 50000.0000 [IU] | ORAL_CAPSULE | ORAL | 2 refills | Status: AC

## 2021-11-17 NOTE — Addendum Note (Signed)
Addended by: Roney Jaffe on: 11/17/2021 08:05 AM   Modules accepted: Orders

## 2021-11-29 ENCOUNTER — Telehealth: Payer: Self-pay | Admitting: *Deleted

## 2021-11-29 NOTE — Telephone Encounter (Signed)
Patient verified DOB Patient is aware of labs and has picked up vitamin d to take weekly.
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
